# Patient Record
Sex: Male | Born: 2000 | Race: Black or African American | Hispanic: No | Marital: Single | Smoking: Never smoker
Health system: Southern US, Community
[De-identification: ages and names within clinical notes are randomized; demographics above are authoritative.]

## PROBLEM LIST (undated history)

## (undated) DIAGNOSIS — I429 Cardiomyopathy, unspecified: Secondary | ICD-10-CM

---

## 2019-04-30 ENCOUNTER — Emergency Department (HOSPITAL_COMMUNITY)
Admission: EM | Admit: 2019-04-30 | Discharge: 2019-04-30 | Disposition: A | Discharge status: Home / Self Care | Payer: BC Managed Care – PPO | Attending: Emergency Medicine | Admitting: Emergency Medicine

## 2019-04-30 ENCOUNTER — Encounter (HOSPITAL_COMMUNITY): Payer: Self-pay | Admitting: Emergency Medicine

## 2019-04-30 ENCOUNTER — Emergency Department (HOSPITAL_COMMUNITY): Payer: BC Managed Care – PPO

## 2019-04-30 DIAGNOSIS — R079 Chest pain, unspecified: Secondary | ICD-10-CM

## 2019-04-30 DIAGNOSIS — R0789 Other chest pain: Secondary | ICD-10-CM | POA: Insufficient documentation

## 2019-04-30 LAB — TROPONIN I (HIGH SENSITIVITY)
Troponin I (High Sensitivity): 13 ng/L (ref <18)
Troponin I (High Sensitivity): 12 ng/L (ref <18)

## 2019-04-30 LAB — BASIC METABOLIC PANEL
Anion gap: 9 (ref 5–15)
BUN: 11 mg/dL (ref 6–20)
CO2: 25 mmol/L (ref 22–32)
Calcium: 9.1 mg/dL (ref 8.9–10.3)
Chloride: 106 mmol/L (ref 98–111)
Creatinine, Ser: 1.15 mg/dL (ref 0.61–1.24)
GFR calc Af Amer: >60 mL/min (ref >60)
GFR calc non Af Amer: >60 mL/min (ref >60)
Glucose, Bld: 85 mg/dL (ref 70–99)
Potassium: 3.9 mmol/L (ref 3.5–5.1)
Sodium: 140 mmol/L (ref 135–145)

## 2019-04-30 LAB — CBC
HCT: 44.1 % (ref 39.0–52.0)
Hemoglobin: 13.9 g/dL (ref 13.0–17.0)
MCH: 28.8 pg (ref 26.0–34.0)
MCHC: 31.5 g/dL (ref 30.0–36.0)
MCV: 91.5 fL (ref 80.0–100.0)
Platelets: 320 10*3/uL (ref 150–400)
RBC: 4.82 MIL/uL (ref 4.22–5.81)
RDW: 12.5 % (ref 11.5–15.5)
WBC: 5.5 10*3/uL (ref 4.0–10.5)
nRBC: 0 % (ref 0.0–0.2)

## 2019-04-30 MED ORDER — SODIUM CHLORIDE 0.9% FLUSH: 3.0000 mL | Freq: Once | INTRAVENOUS | Status: DC

## 2019-04-30 NOTE — ED Triage Notes (Signed)
Pt. Stated, Ive had chest pain for over a week. No other symptoms.

## 2019-04-30 NOTE — ED Provider Notes (Signed)
[facility] EMERGENCY DEPARTMENT Provider Note   CSN: [id number] Arrival date & time: 04/30/19  1512     History   Chief Complaint Chief Complaint  Patient presents with   Chest Pain    HPI Nathan Clayton is a 18 y.o. male.  HPI: A 18 year old patient presents for evaluation of chest pain. Initial onset of pain was less than one hour ago. The patient's chest pain is described as heaviness/pressure/tightness and is worse with exertion. The patient's chest pain is middle- or left-sided, is not well-localized, is not sharp and does not radiate to the arms/jaw/neck. The patient does not complain of nausea and denies diaphoresis. The patient has no history of stroke, has no history of peripheral artery disease, has not smoked in the past 90 days, denies any history of treated diabetes, has no relevant family history of coronary artery disease (first degree relative at less than age 43), is not hypertensive, has no history of hypercholesterolemia and does not have an elevated BMI (>=30).    HPI   18 year old male presents today with complaints of chest pain.  Patient notes approximately 1.5 weeks ago he started developing tightness pressure and shortness of breath.  Patient notes this is worse with ambulation.  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  Social History Social History   Tobacco Use   Smoking status: Never Smoker   Smokeless tobacco: Never Used  Substance Use Topics   Alcohol use: Not Currently   Drug use: Not Currently     Allergies   Patient has no allergy information on record.   Review of Systems Review of Systems  All other systems reviewed and are negative.    Physical Exam Updated Vital Signs BP (!) 154/86    Pulse 62    Temp 98.5 F (36.9 C) (Oral)    Resp 12    SpO2 100%   Physical  Exam Vitals signs and nursing note reviewed.  Constitutional:      Appearance: He is well-developed.  HENT:     Head: Normocephalic and atraumatic.  Eyes:     General: No scleral icterus.       Right eye: No discharge.        Left eye: No discharge.     Conjunctiva/sclera: Conjunctivae normal.     Pupils: Pupils are equal, round, and reactive to light.  Neck:     Musculoskeletal: Normal range of motion.     Vascular: No JVD.     Trachea: No tracheal deviation.  Pulmonary:     Effort: Pulmonary effort is normal.     Breath sounds: No stridor.  Neurological:     Mental Status: He is alert and oriented to person, place, and time.     Coordination: Coordination normal.  Psychiatric:        Behavior: Behavior normal.        Thought Content: Thought content normal.        Judgment: Judgment normal.     ED Treatments / Results  Labs (all labs ordered are listed, but only abnormal results are displayed) Labs Reviewed  BASIC METABOLIC PANEL  CBC  TROPONIN I (HIGH SENSITIVITY)  TROPONIN I (HIGH SENSITIVITY)    EKG EKG Interpretation  Date/Time:  Monday April 30 2019 15:38:10 EDT Ventricular  Rate:  70 PR Interval:  158 QRS Duration: 94 QT Interval:  354 QTC Calculation: 382 R Axis:   102 Text Interpretation:  Normal sinus rhythm Biatrial enlargement Rightward axis Incomplete right bundle branch block Septal infarct , age undetermined T wave abnormality, consider inferolateral ischemia Abnormal ECG No old tracing to compare Confirmed by Linwood Dibbles [phone]) on 04/30/2019 5:53:23 PM   Radiology Dg Chest 2 View  Result Date: 04/30/2019 CLINICAL DATA:  Chest pain EXAM: CHEST - 2 VIEW COMPARISON:  None. FINDINGS: The heart size and mediastinal contours are within normal limits. Both lungs are clear. The visualized skeletal structures are unremarkable. IMPRESSION: No active cardiopulmonary disease. Electronically Signed   By: Gerome Sam III M.D   On: 04/30/2019 15:54     Procedures Procedures (including critical care time)  Medications Ordered in ED Medications  sodium chloride flush (NS) 0.9 % injection 3 mL (has no administration in time range)     Initial Impression / Assessment and Plan / ED Course  I have reviewed the triage vital signs and the nursing notes.  Pertinent labs & imaging results that were available during my care of the patient were reviewed by me and considered in my medical decision making (see chart for details).     HEAR Score: 2   Labs: Troponin x2, ED EKG, chest x-ray, CBC, BMP  Imaging:  Consults:  Therapeutics:  Discharge Meds:   Assessment/Plan: 18 year old male presents today with complaints of chest discomfort.  Patient has a history of hypertrophic cardiomyopathy, question similar presentation presently.  He is well-appearing in no acute distress.  His heart score is 2, case was discussed with attending physician who agrees to assessment and plan today.  Patient stable for outpatient cardiology follow-up.  I discussed plan with the patient, his mother, and also his aunt who was on the phone.  He will follow closely he will return immediately if develops any new or worsening signs or symptoms.  He will not participate in any strenuous physical activity until cardiology follow-up. Present verbalized understanding and agreement to today's plan had no further questions or concerns at time of discharge.   Final Clinical Impressions(s) / ED Diagnoses   Final diagnoses:  Chest pain, unspecified type    ED Discharge Orders    None       Rosalio Loud 04/30/19 Ocie Cornfield, MD 05/02/19 [phone]

## 2019-04-30 NOTE — Discharge Instructions (Addendum)
Please read attached information. If you experience any new or worsening signs or symptoms please return to the emergency room for evaluation. Please follow-up with your primary care provider or specialist as discussed.  °

## 2019-04-30 NOTE — ED Notes (Signed)
Patient verbalizes understanding of discharge instructions. Opportunity for questioning and answers were provided. Armband removed by staff, pt discharged from ED.  

## 2019-05-11 ENCOUNTER — Encounter: Payer: Self-pay | Admitting: Cardiovascular Disease

## 2019-05-11 ENCOUNTER — Ambulatory Visit (INDEPENDENT_AMBULATORY_CARE_PROVIDER_SITE_OTHER): Payer: Self-pay | Admitting: Cardiovascular Disease

## 2019-05-11 ENCOUNTER — Telehealth: Payer: Self-pay

## 2019-05-11 ENCOUNTER — Other Ambulatory Visit: Payer: Self-pay

## 2019-05-11 VITALS — BP 124/80 | HR 66 | Temp 97.9°F | Ht 74.0 in | Wt 187.0 lb

## 2019-05-11 DIAGNOSIS — I421 Obstructive hypertrophic cardiomyopathy: Secondary | ICD-10-CM | POA: Insufficient documentation

## 2019-05-11 DIAGNOSIS — R079 Chest pain, unspecified: Secondary | ICD-10-CM

## 2019-05-11 DIAGNOSIS — R0789 Other chest pain: Secondary | ICD-10-CM

## 2019-05-11 NOTE — Patient Instructions (Addendum)
Medication Instructions:  Your physician recommends that you continue on your current medications as directed. Please refer to the Current Medication list given to you today.  If you need a refill on your cardiac medications before your next appointment, please call your pharmacy.   Lab work: None. PLEASE HAVE YOUR PEDIATRIC CARDIOLOGIST IN [place], [place] FAX YOUR RECORDS TO OUR OFFICE AT [phone] If you have labs (blood work) drawn today and your tests are completely normal, you will receive your results only by: Marland Kitchen MyChart Message (if you have MyChart) OR . A paper copy in the mail If you have any lab test that is abnormal or we need to change your treatment, we will call you to review the results.  Testing/Procedures: Your physician has requested that you have an echocardiogram. Echocardiography is a painless test that uses sound waves to create images of your heart. It provides your doctor with information about the size and shape of your heart and how well your heart's chambers and valves are working. This procedure takes approximately one hour. There are no restrictions for this procedure. LOCATION: [facility]: [place], [place], [place] [address] TO BE SCHEDULED   Follow-Up: At [facility], you and your health needs are our priority.  As part of our continuing mission to provide you with exceptional heart care, we have created designated Provider Care Teams.  These Care Teams include your primary Cardiologist (physician) and Advanced Practice Providers (APPs -  Physician Assistants and Nurse Practitioners) who all work together to provide you with the care you need, when you need it. You will need a follow up appointment in 3 months with Dr. Quay Burow.  Please call our office 2 months in advance to schedule this appointment.

## 2019-05-11 NOTE — Telephone Encounter (Signed)
Dr. Gwenlyn Found requested that pt pediatric cardiologist, Dr. Marcellina Millin, be contacted so that he could speak with him.  Marcellina Millin, MD  [address]  [place], [place] [address]  [phone]   Spoke with Threasa Beards who stated that Dr. Linton Rump out of town and will not be back until Monday of next week. Informed her that the next time Dr. Gwenlyn Found is in the office is next week Tuesday. She states Dr. Linton Rump will be in office next week Tuesday as well. Requested that she send a message to Dr. Linton Rump requesting that he call Dr. Gwenlyn Found on this coming Tuesday. She verbalized understanding and will do so.

## 2019-05-11 NOTE — Progress Notes (Signed)
05/11/2019 Frederik Pear   10/03/2000  [id number]  Primary Physician Patient, No Pcp Per Primary Cardiologist: Runell Gess MD Nicholes Calamity, [place]  HPI:  NICKOLAOS BRALLIER is a 18 y.o. thin and fit appearing single African-American male accompanied by his mother Tamera Punt who was referred by the emergency room for evaluation of chest pain.  He has no cardiac risk factors.  Is on no medications.  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  He apparently saw a pediatric cardiologist in [place] and was diagnosed with hypertrophic cardiomyopathy.  This records were not available to me currently.  He was recently seen in the emergency room by Dr. Lynelle Doctor 04/22/2019.  Enzymes were negative.  EKG did show LVH with repolarization changes.  He gets chest pain principally with exertion.  There is no family history of hypertrophic cardiomyopathy or sudden cardiac death.   No outpatient medications have been marked as taking for the 05/11/19 encounter (Office Visit) with Runell Gess, MD.     No Known Allergies  Social History   Socioeconomic History  . Marital status: Single    Spouse name: Not on file  . Number of children: Not on file  . Years of education: Not on file  . Highest education level: Not on file  Occupational History  . Not on file  Social Needs  . Financial resource strain: Not on file  . Food insecurity    Worry: Not on file    Inability: Not on file  . Transportation needs    Medical: Not on file    Non-medical: Not on file  Tobacco Use  . Smoking status: Never Smoker  . Smokeless tobacco: Never Used  Substance and Sexual Activity  . Alcohol use: Not Currently  . Drug use: Not Currently  . Sexual activity: Not on file  Lifestyle  . Physical activity    Days per week: Not on file    Minutes per session: Not on file  . Stress: Not on file  Relationships  . Social Musician on phone: Not on file    Gets together: Not on file     Attends religious service: Not on file    Active member of club or organization: Not on file    Attends meetings of clubs or organizations: Not on file    Relationship status: Not on file  . Intimate partner violence    Fear of current or ex partner: Not on file    Emotionally abused: Not on file    Physically abused: Not on file    Forced sexual activity: Not on file  Other Topics Concern  . Not on file  Social History Narrative  . Not on file     Review of Systems: General: negative for chills, fever, night sweats or weight changes.  Cardiovascular: negative for chest pain, dyspnea on exertion, edema, orthopnea, palpitations, paroxysmal nocturnal dyspnea or shortness of breath Dermatological: negative for rash Respiratory: negative for cough or wheezing Urologic: negative for hematuria Abdominal: negative for nausea, vomiting, diarrhea, bright red blood per rectum, melena, or hematemesis Neurologic: negative for visual changes, syncope, or dizziness All other systems reviewed and are otherwise negative except as noted above.    Blood pressure 124/80, pulse 66, temperature 97.9 F (36.6 C), height 6\' 2"  (1.88 m), weight 187 lb (84.8 kg).  General appearance: alert and no distress Neck: no adenopathy, no carotid bruit, no JVD,  supple, symmetrical, trachea midline and thyroid not enlarged, symmetric, no tenderness/mass/nodules Lungs: clear to auscultation bilaterally Heart: regular rate and rhythm, S1, S2 normal, no murmur, click, rub or gallop Extremities: extremities normal, atraumatic, no cyanosis or edema Pulses: 2+ and symmetric Skin: Skin color, texture, turgor normal. No rashes or lesions Neurologic: Alert and oriented X 3, normal strength and tone. Normal symmetric reflexes. Normal coordination and gait  EKG not performed today  ASSESSMENT AND PLAN:   Atypical chest pain Mr. 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  He was recently seen in the ER 04/22/2019 with chest pain.  He has had chest pain for several years and was evaluated by a pediatric cardiologist in [place].  His work-up in the ER was unrevealing.  His enzymes were negative.  His EKG does show changes of LVH with repolarization changes although he is a young male with an EF less than 35 and he does not necessarily meet voltage criteria.  There is a question of hypertrophic cardiomyopathy being worked up by a cardiologist in [place].  I am going to obtain those records.  I do not think his symptoms are ischemically mediated.  If he is a hypertroph I will refer him to the appropriate center for further evaluation.      Lorretta Harp MD FACP,FACC,FAHA, [facility] 05/11/2019 10:48 AM

## 2019-05-11 NOTE — Assessment & Plan Note (Signed)
Mr. 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  He was recently seen in the ER 04/22/2019 with chest pain.  He has had chest pain for several years and was evaluated by a pediatric cardiologist in [place].  His work-up in the ER was unrevealing.  His enzymes were negative.  His EKG does show changes of LVH with repolarization changes although he is a young male with an EF less than 35 and he does not necessarily meet voltage criteria.  There is a question of hypertrophic cardiomyopathy being worked up by a cardiologist in [place].  I am going to obtain those records.  I do not think his symptoms are ischemically mediated.  If he is a hypertroph I will refer him to the appropriate center for further evaluation.

## 2019-05-15 ENCOUNTER — Telehealth: Payer: Self-pay

## 2019-05-15 NOTE — Telephone Encounter (Signed)
Dr. Gwenlyn Found requested that pt pediatric cardiologist, Dr. Marcellina Millin, be contacted so that he could speak with him.  Marcellina Millin, MD  [address]  [place], [place] [address]  [phone]   Spoke with Threasa Beards who stated that Dr. Linton Rump out of town and will not be back until Monday of next week. Informed her that the next time Dr. Gwenlyn Found is in the office is next week Tuesday. She states Dr. Linton Rump will be in office next week Tuesday as well. Requested that she send a message to Dr. Linton Rump requesting that he call Dr. Gwenlyn Found on this coming Tuesday. She verbalized understanding and will do so.   Contacted office again today. Dr. Linton Rump called Dr. Gwenlyn Found back

## 2019-05-15 NOTE — Telephone Encounter (Addendum)
Contacted pt. DPR ON FILE. Left detailed message stating that Dr. Gwenlyn Found spoke with his old pediatric cardiologist, Dr. Marcellina Millin, who reviewed his chart and felt that there was nothing cardiovascular that was causing the pt CP. Dr. Gwenlyn Found is okay with pt returning back to football practice with no restrictions after results of echo and pt may follow up PRN. Advised to call back with any questions or concerns

## 2019-05-22 ENCOUNTER — Other Ambulatory Visit: Payer: Self-pay

## 2019-05-22 ENCOUNTER — Ambulatory Visit (HOSPITAL_COMMUNITY): Payer: Self-pay | Attending: Cardiology

## 2019-05-22 DIAGNOSIS — R079 Chest pain, unspecified: Secondary | ICD-10-CM | POA: Insufficient documentation

## 2019-12-18 ENCOUNTER — Telehealth: Payer: Self-pay | Admitting: *Deleted

## 2019-12-18 NOTE — Telephone Encounter (Signed)
A message was left, re: his follow up visit. 

## 2020-01-04 ENCOUNTER — Encounter: Payer: Self-pay | Admitting: General Practice

## 2020-03-13 ENCOUNTER — Observation Stay (HOSPITAL_COMMUNITY)
Admission: EM | Admit: 2020-03-13 | Discharge: 2020-03-14 | Disposition: A | Discharge status: Home / Self Care | Payer: 59 | Attending: Internal Medicine | Admitting: Internal Medicine

## 2020-03-13 ENCOUNTER — Encounter (HOSPITAL_COMMUNITY): Payer: Self-pay | Admitting: Emergency Medicine

## 2020-03-13 ENCOUNTER — Other Ambulatory Visit: Payer: Self-pay

## 2020-03-13 ENCOUNTER — Observation Stay (HOSPITAL_BASED_OUTPATIENT_CLINIC_OR_DEPARTMENT_OTHER): Payer: 59

## 2020-03-13 DIAGNOSIS — R0789 Other chest pain: Secondary | ICD-10-CM

## 2020-03-13 DIAGNOSIS — R42 Dizziness and giddiness: Secondary | ICD-10-CM

## 2020-03-13 DIAGNOSIS — I421 Obstructive hypertrophic cardiomyopathy: Secondary | ICD-10-CM

## 2020-03-13 DIAGNOSIS — Z20822 Contact with and (suspected) exposure to covid-19: Secondary | ICD-10-CM | POA: Insufficient documentation

## 2020-03-13 DIAGNOSIS — I422 Other hypertrophic cardiomyopathy: Secondary | ICD-10-CM | POA: Insufficient documentation

## 2020-03-13 DIAGNOSIS — R7401 Elevation of levels of liver transaminase levels: Secondary | ICD-10-CM | POA: Diagnosis not present

## 2020-03-13 DIAGNOSIS — R079 Chest pain, unspecified: Secondary | ICD-10-CM

## 2020-03-13 DIAGNOSIS — R55 Syncope and collapse: Secondary | ICD-10-CM | POA: Diagnosis not present

## 2020-03-13 DIAGNOSIS — N179 Acute kidney failure, unspecified: Secondary | ICD-10-CM | POA: Diagnosis not present

## 2020-03-13 HISTORY — DX: Cardiomyopathy, unspecified: I42.9

## 2020-03-13 LAB — COMPREHENSIVE METABOLIC PANEL
ALT: 23 U/L (ref 0–44)
AST: 49 U/L — ABNORMAL HIGH (ref 15–41)
Albumin: 4.2 g/dL (ref 3.5–5.0)
Alkaline Phosphatase: 47 U/L (ref 38–126)
Anion gap: 16 — ABNORMAL HIGH (ref 5–15)
BUN: 12 mg/dL (ref 6–20)
CO2: 20 mmol/L — ABNORMAL LOW (ref 22–32)
Calcium: 9.6 mg/dL (ref 8.9–10.3)
Chloride: 103 mmol/L (ref 98–111)
Creatinine, Ser: 1.52 mg/dL — ABNORMAL HIGH (ref 0.61–1.24)
GFR calc Af Amer: >60 mL/min (ref >60)
GFR calc non Af Amer: >60 mL/min (ref >60)
Glucose, Bld: 87 mg/dL (ref 70–99)
Potassium: 3.8 mmol/L (ref 3.5–5.1)
Sodium: 139 mmol/L (ref 135–145)
Total Bilirubin: 0.8 mg/dL (ref 0.3–1.2)
Total Protein: 7.8 g/dL (ref 6.5–8.1)

## 2020-03-13 LAB — TROPONIN I (HIGH SENSITIVITY)
Troponin I (High Sensitivity): 6 ng/L (ref <18)
Troponin I (High Sensitivity): 10 ng/L (ref <18)

## 2020-03-13 LAB — CBC
HCT: 48.5 % (ref 39.0–52.0)
Hemoglobin: 14.8 g/dL (ref 13.0–17.0)
MCH: 27.8 pg (ref 26.0–34.0)
MCHC: 30.5 g/dL (ref 30.0–36.0)
MCV: 91 fL (ref 80.0–100.0)
Platelets: 309 10*3/uL (ref 150–400)
RBC: 5.33 MIL/uL (ref 4.22–5.81)
RDW: 12.1 % (ref 11.5–15.5)
WBC: 6.4 10*3/uL (ref 4.0–10.5)
nRBC: 0 % (ref 0.0–0.2)

## 2020-03-13 LAB — ECHOCARDIOGRAM COMPLETE
Area-P 1/2: 3.08 cm2
S' Lateral: 2.7 cm

## 2020-03-13 LAB — SARS CORONAVIRUS 2 BY RT PCR (HOSPITAL ORDER, PERFORMED IN ~~LOC~~ HOSPITAL LAB): SARS Coronavirus 2: NEGATIVE

## 2020-03-13 MED ORDER — ACETAMINOPHEN 325 MG PO TABS: 650.0000 mg | ORAL_TABLET | ORAL | Status: DC | PRN

## 2020-03-13 MED ORDER — SODIUM CHLORIDE 0.9 % IV BOLUS
1000.0000 mL | Freq: Once | INTRAVENOUS | Status: AC
  Administered 2020-03-13: 1000 mL via INTRAVENOUS

## 2020-03-13 MED ORDER — ONDANSETRON HCL 4 MG/2ML IJ SOLN: 4.0000 mg | Freq: Four times a day (QID) | INTRAMUSCULAR | Status: DC | PRN

## 2020-03-13 MED ORDER — NITROGLYCERIN 0.4 MG SL SUBL: 0.4000 mg | SUBLINGUAL_TABLET | SUBLINGUAL | Status: DC | PRN

## 2020-03-13 NOTE — H&P (Addendum)
Cardiology Admission History and Physical:   Patient ID: Nathan Clayton MRN: [id number]; DOB: [birth year]   Admission date: 03/13/2020  Primary Care Provider: Patient, No Pcp Per [facility] Cardiologist: Quay Burow, MD  Elias-F[facility] Solis Electrophysiologist:  None   Chief Complaint:  Chest Pain  Patient Profile:   Nathan Clayton is a 19 y.o. male with a history of chest pain and possible hypertrophic cardiomyopathy who presents to the ED today with chest pain and near syncope.  History of Present Illness:   Nathan Clayton is a 19 year old male with history of chest pain and possible hypertrophic cardiomyopathy who has been seen by Dr. Gwenlyn Found in the past. 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 He saw a pediatric cardiologist in [place], [place], and was diagnosed with hypertrophic cardiomyopathy. Per review in Care Everywhere, he underwent and Cardiopulmonary Exercise Test in 12/2017 which was abnormal and showed mildly reduced exercise capacity consistent with ventilatory limitations. There was no evidence of exercise induced cardiac dysrhythmias, ischemia, or bronchospasm.He was seen in the ED in 04/2019 for chest pain that was worse with exertion. EKG showed LVH with repolarization changes. Enzymes were negative. He was felt to be stable for discharge with outpatient Cardiology follow-up. He was seen by Dr. Gwenlyn Found in 05/2019 and Echo was ordered for further evaluation. Echo showed LVEF of 60-65% with moderate LVH and asymmetric hypertrophy in the posterior wall. Average LV global longitudinal strain is -18.3. This pattern felt to be atypical for hypertrophic cardiomyopathy.  Patient presented to the [facility] ED today for further evaluation of chest pain and near syncope while at football practice at [employer]. Patient reports he had been practicing/running for 30 minutes when he developed left sided  non-radiating chest pain. Describes this pain as a tightness/sharpness. Similar to pain that he usual gets just slightly worse. He states he typically has chest pain at least 2 times per week, usually with exertion but sometimes at rest. He notes shortness of breath, palpitations, and sweating with the chest pain but he was running in 90 degree heat at the time. No nausea or vomiting. After onset of chest pain, he developed lightheadedness and dizziness and felt like he may pass out. No orthopnea, PND, or edema. No recent fevers or illnesses. No URI symptoms or GI symptoms. Patient told his coach about his symptoms and coach placed him in an ice bath. Chest pain persisted so he came to the ED for further evaluation. Patient received 324 mg of Aspirin prior to arrival.  In the ED, vitals stable. EKG showed normal sinus rhythm, rate 97 bpm, with right axis deviation, repolarization pattern in anterior leads, and mild T wave inversions in inferior leads (may be repolarization changes from LVH). Initial high-sensitivity troponin negative. WBC 6.4, Hgb 14.8, Plts 309. Na 139, K 3.8, Glucose 87, BUN 12, Cr 1.52. Albumin 4.2, AST 49, ALT 23, Alk Phos 47, Total Bili 0.8. COVID-19 pending.   At the time of this evaluation, patient still having 5/6 out of 10 chest pain but resting comfortably.   He denies any tobacco, alcohol, or recreational drug use. No known family history of HOCM. However, he does have a family history of a heart disease with paternal grandfather having a MI in his mid 15's to 27's. Patient's father also reports that his grandparents (patient's great grandparents) have history of MI. Father states several of his relatives  died suddenly from a MI.   Past Medical History:  Diagnosis Date  . Cardiomyopathy [facility])     History reviewed. No pertinent surgical history.   Medications Prior to Admission: Prior to Admission medications   Not on File     Allergies:   No Known Allergies  Social  History:   Social History   Socioeconomic History  . Marital status: Single    Spouse name: Not on file  . Number of children: Not on file  . Years of education: Not on file  . Highest education level: Not on file  Occupational History  . Not on file  Tobacco Use  . Smoking status: Never Smoker  . Smokeless tobacco: Never Used  Substance and Sexual Activity  . Alcohol use: Not Currently  . Drug use: Not Currently  . Sexual activity: Not on file  Other Topics Concern  . Not on file  Social History Narrative  . Not on file   Social Determinants of Health   Financial Resource Strain:   . Difficulty of Paying Living Expenses:   Food Insecurity:   . Worried About Charity fundraiser in the Last Year:   . Arboriculturist in the Last Year:   Transportation Needs:   . Film/video editor (Medical):   Marland Kitchen Lack of Transportation (Non-Medical):   Physical Activity:   . Days of Exercise per Week:   . Minutes of Exercise per Session:   Stress:   . Feeling of Stress :   Social Connections:   . Frequency of Communication with Friends and Family:   . Frequency of Social Gatherings with Friends and Family:   . Attends Religious Services:   . Active Member of Clubs or Organizations:   . Attends Archivist Meetings:   Marland Kitchen Marital Status:   Intimate Partner Violence:   . Fear of Current or Ex-Partner:   . Emotionally Abused:   Marland Kitchen Physically Abused:   . Sexually Abused:     Family History:   The patient's family history includes Kidney failure in his paternal grandmother; Lupus in his paternal grandmother; Stroke in his paternal grandfather.    ROS:  Please see the history of present illness.  All other ROS reviewed and negative.     Physical Exam/Data:   Vitals:   03/13/20 1022  BP: 135/83  Pulse: 95  Resp: 16  Temp: 97.8 F (36.6 C)  TempSrc: Oral  SpO2: 100%   No intake or output data in the 24 hours ending 03/13/20 1243 Last 3 Weights 05/11/2019  Weight  (lbs) 187 lb  Weight (kg) 84.823 kg     There is no height or weight on file to calculate BMI.  General: 19 y.o. thin African-American male resting comfortably in no acute distress.  HEENT: Normocephalic and atraumatic. Sclera clear.  Neck: Supple. No carotid bruits. No JVD. Heart: Tachycardic with regular rhythm. II-III/VI systolic murmur heard best at left upper sternal border. Distinct S1 and S2. No gallops or rubs. Radial and distal pedal pulses 2+ and equal bilaterally. Lungs: No increased work of breathing. Clear to ausculation bilaterally. No wheezes, rhonchi, or rales.  Abdomen: Soft, non-distended, and non-tender to palpation. Bowel sounds present. MSK: Normal strength and tone for age. Extremities: No lower extremity edema.    Skin: Warm and dry. Neuro: Alert and oriented x3. No focal deficits. Psych: Normal affect. Responds appropriately.  EKG: The ECG that was done was personally reviewed and demonstrates normal sinus  rhythm, rate 97 bpm, with right axis deviation, repolarization pattern in anterior leads, and mild T wave inversions in inferior leads (may be repolarization changes from LVH)  Telemetry: Telemetry personally reviewed and demonstrates Sinus rhythm with rates ranging from the 90's to 120's.  Relevant CV Studies:  Echocardiogram 05/22/2019: Impressions: 1. Left ventricular ejection fraction, by visual estimation, is 60 to  65%. The left ventricle has normal function. There is moderately increased  left ventricular hypertrophy.  2. Asymmetric hypertrophy measuring 36m in posterior wall (157min basal  septum). Could represent hypertrophic cardiomyopathy, but would be an  unusual variant with posterior wall hypertrophy more pronounced than  septum. Consider cardiac MRI for further  evaluation  3. The average left ventricular global longitudinal strain is -18.3 %.  4. Global right ventricle has normal systolic function.The right  ventricular size is normal.  No increase in right ventricular wall  thickness.  5. Left atrial size was normal.  6. Right atrial size was normal.  7. Small pericardial effusion.  8. The mitral valve is normal in structure. Trace mitral valve  regurgitation.  9. The tricuspid valve is normal in structure. Tricuspid valve  regurgitation is trivial.  10. The aortic valve is tricuspid Aortic valve regurgitation was not  visualized by color flow Doppler.  11. The pulmonic valve was grossly normal. Pulmonic valve regurgitation is  not visualized by color flow Doppler.  12. The tricuspid regurgitant velocity is 2.08 m/s, and with an assumed  right atrial pressure of 3 mmHg, the estimated right ventricular systolic  pressure is normal at 20.4 mmHg.  13. The inferior vena cava is normal in size with greater than 50%  respiratory variability, suggesting right atrial pressure of 3 mmHg.   Laboratory Data:  High Sensitivity Troponin:   Recent Labs  Lab 03/13/20 1047  TROPONINIHS 6      Chemistry Recent Labs  Lab 03/13/20 1047  NA 139  K 3.8  CL 103  CO2 20*  GLUCOSE 87  BUN 12  CREATININE 1.52*  CALCIUM 9.6  GFRNONAA >60  GFRAA >60  ANIONGAP 16*    Recent Labs  Lab 03/13/20 1047  PROT 7.8  ALBUMIN 4.2  AST 49*  ALT 23  ALKPHOS 47  BILITOT 0.8   Hematology Recent Labs  Lab 03/13/20 1047  WBC 6.4  RBC 5.33  HGB 14.8  HCT 48.5  MCV 91.0  MCH 27.8  MCHC 30.5  RDW 12.1  PLT 309   BNPNo results for input(s): BNP, PROBNP in the last 168 hours.  DDimer No results for input(s): DDIMER in the last 168 hours.   Radiology/Studies:  No results found.  TIMI Risk Score for Unstable Angina or Non-ST Elevation MI:   The patient's TIMI risk score is 0-1, which indicates a 5% risk of all cause mortality, new or recurrent myocardial infarction or need for urgent revascularization in the next 14 days.     Assessment and Plan:   Chest Pain - Patient presents with chest pain and near syncope  while at football practice. - EKG shows no acute ischemic changes compared to prior tracings. - Initial high-sensitivity troponin negative. Repeat pending. - Patient still having mild 5/10 chest tightness. - Do not suspect this is due to CAD/ACS. - Discussed with MD - will admit for observation and plan for cardiac MRI tomorrow given history of hypertrophic cardiomyoapthy.  Near Syncope - Suspect this was do to exercising in extreme heat/dehydration given mild AKI. - Receiving 1L fluid  bolus in the ED. - Will check orthostatic vital signs. - Continue telemetry.  Hypertrophic Cardiomyopathy - Diagnosed with hypertrophic cardiomyopathy by pediatric Cardiologist in [place], [place], and felt to be due to athlete's heart vs hypertensive changes. Noted to have elevated BP at multiple visit raising concern for hypertension but patient denies formal diagnosis of this. Cardiopulmonary Exercise Test in 12/2017 which was abnormal and showed mildly reduced exercise capacity consistent with ventilatory limitations. There was no evidence of exercise induced cardiac dysrhythmias, ischemia, or bronchospasm. - Last Echo in 05/2019 showed LVEF of 60-65% with moderate LVH and asymmetric hypertrophy measuring 41m in posterior wall (felt to be atypical for hypertrophic cardiomyopathy). Average LV global longitudinal strain -18.3. - Prominent systolic murmur noted on exam. - Will order cardiac MRI for further evaluation.  AKI - Creatinine 1.51 on presentation. Baseline 1.1.  - Suspect this is secondary to dehydration.  - Will give another 1L of normal saline. - Repeat CMET tomorrow.  Elevated AST - AST minimally elevated at 49.  - Will repeat CMET tomorrow.  Mild Anion Gap - Anion gap of 16. - Wonder if this could be due to dehydration. - Will repeat CMET tomorrow.  Severity of Illness: The appropriate patient status for this patient is OBSERVATION. Observation status is judged to be reasonable and  necessary in order to provide the required intensity of service to ensure the patient's safety. The patient's presenting symptoms, physical exam findings, and initial radiographic and laboratory data in the context of their medical condition is felt to place them at decreased risk for further clinical deterioration. Furthermore, it is anticipated that the patient will be medically stable for discharge from the hospital within 2 midnights of admission. The following factors support the patient status of observation.   " The patient's presenting symptoms include chest pain and near syncope. " The physical exam findings as above. " The initial radiographic and laboratory data as above.     For questions or updates, please contact CRocky Boy WestPlease consult [email or web address] for contact info under     Signed, CDarreld Mclean PA-C  03/13/2020 12:43 PM

## 2020-03-13 NOTE — ED Provider Notes (Signed)
[facility] EMERGENCY DEPARTMENT Provider Note   CSN: [id number] Arrival date & time: 03/13/20  1019     History Chief Complaint  Patient presents with  . Near Syncope    DANTHONY KENDRIX is a 19 y.o. male.  HPI    19 yo male history of possible hypertrophic cardiomyopathy presents today after football practice.  He was out practicing in the 9 degree heat for approximately 30 minutes when he began to have some chest pain and feel generally weak.  He stopped playing.  The coach initiated an ice bath.  He is currently shivering and cold but not having any ongoing chest discomfort.  I have reviewed his records.  He was seen with a similar episode a year ago.  At that time, he was referred to cardiology.  They noted that he had been seen previously by a pediatric cardiologist and was being worked up for hypertrophic cardiomyopathy.  He had LVH noted on his EKG.  Is unable to elicit any definitive diagnosis from the notes.   Past Medical History:  Diagnosis Date  . Cardiomyopathy [facility])     Patient Active Problem List   Diagnosis Date Noted  . Atypical chest pain 05/11/2019  . Obstructive hypertrophic cardiomyopathy (HCC) 05/11/2019    History reviewed. No pertinent surgical history.     Family History  Problem Relation Age of Onset  . Lupus Paternal Grandmother   . Kidney failure Paternal Grandmother   . Stroke Paternal Grandfather     Social History   Tobacco Use  . Smoking status: Never Smoker  . Smokeless tobacco: Never Used  Substance Use Topics  . Alcohol use: Not Currently  . Drug use: Not Currently    Home Medications Prior to Admission medications   Not on File    Allergies    Patient has no known allergies.  Review of Systems   Review of Systems  All other systems reviewed and are negative.   Physical Exam Updated Vital Signs BP 135/83 (BP Location: Right Arm)   Pulse 95   Temp 97.8 F (36.6 C) (Oral)   Resp 16   SpO2 100%    Physical Exam Vitals reviewed.  Constitutional:      Appearance: Normal appearance.  HENT:     Head: Normocephalic.     Right Ear: External ear normal.     Left Ear: External ear normal.     Nose: Nose normal.     Mouth/Throat:     Mouth: Mucous membranes are moist.     Pharynx: Oropharynx is clear.  Cardiovascular:     Rate and Rhythm: Normal rate and regular rhythm.     Pulses: Normal pulses.  Pulmonary:     Effort: Pulmonary effort is normal.     Breath sounds: Normal breath sounds.  Abdominal:     General: Abdomen is flat.     Palpations: Abdomen is soft.  Musculoskeletal:        General: Normal range of motion.     Cervical back: Normal range of motion.  Skin:    Capillary Refill: Capillary refill takes less than 2 seconds.     Comments: Cool and damp  Neurological:     General: No focal deficit present.     Mental Status: He is alert and oriented to person, place, and time.     Cranial Nerves: No cranial nerve deficit.     Sensory: No sensory deficit.     Motor: No weakness.  Coordination: Coordination normal.  Psychiatric:        Behavior: Behavior normal.     ED Results / Procedures / Treatments   Labs (all labs ordered are listed, but only abnormal results are displayed) Labs Reviewed  CBC  COMPREHENSIVE METABOLIC PANEL  TROPONIN I (HIGH SENSITIVITY)    EKG EKG Interpretation  Date/Time:  Thursday March 13 2020 10:25:07 EDT Ventricular Rate:  97 PR Interval:    QRS Duration: 95 QT Interval:  322 QTC Calculation: 409 R Axis:   95 Text Interpretation: Sinus rhythm Biatrial enlargement Borderline right axis deviation Nonspecific T abnormalities, inferior leads ST elev, probable normal early repol pattern since last ekg t waves in v5 and v6 have resolved otherwise no significant change Confirmed by Margarita Grizzle [phone]) on 03/13/2020 10:35:12 AM   Radiology No results found.  Procedures Procedures (including critical care  time)  Medications Ordered in ED Medications  sodium chloride 0.9 % bolus 1,000 mL (has no administration in time range)    ED Course  I have reviewed the triage vital signs and the nursing notes.  Pertinent labs & imaging results that were available during my care of the patient were reviewed by me and considered in my medical decision making (see chart for details).    MDM Rules/Calculators/A&P                         19 yo male ho ?hypetrophic cardiomyopathy presents today with chest pain during work out.  Patient hemodynamically stable here in ED.  EKG with lvh . Cardiology consulted and seeing patient  Final Clinical Impression(s) / ED Diagnoses Final diagnoses:  Chest pain, unspecified type    Rx / DC Orders ED Discharge Orders    None       Margarita Grizzle, MD 03/13/20 1432

## 2020-03-13 NOTE — Progress Notes (Signed)
  Echocardiogram 2D Echocardiogram has been performed.  Dorena Dew Micahel Omlor 03/13/2020, 4:03 PM

## 2020-03-13 NOTE — ED Triage Notes (Signed)
Patient from [facility] for near syncope. Patient had been at football conditioning for 30 minutes when he started to feel like he was going to pass out. Patient was wearing short sleeve t-shirt and shorts. After telling his coach he was feeling like he would pass out, coach placed patient in a bath of ice water. Patient denies LOC, reports 7/10 chest tightness. Received 324 mg aspirin prior to arrival.

## 2020-03-14 ENCOUNTER — Observation Stay (HOSPITAL_COMMUNITY): Payer: 59

## 2020-03-14 DIAGNOSIS — I421 Obstructive hypertrophic cardiomyopathy: Secondary | ICD-10-CM | POA: Diagnosis not present

## 2020-03-14 DIAGNOSIS — R079 Chest pain, unspecified: Secondary | ICD-10-CM | POA: Diagnosis not present

## 2020-03-14 DIAGNOSIS — N179 Acute kidney failure, unspecified: Secondary | ICD-10-CM | POA: Diagnosis not present

## 2020-03-14 DIAGNOSIS — R42 Dizziness and giddiness: Secondary | ICD-10-CM | POA: Diagnosis not present

## 2020-03-14 LAB — COMPREHENSIVE METABOLIC PANEL
ALT: 22 U/L (ref 0–44)
AST: 51 U/L — ABNORMAL HIGH (ref 15–41)
Albumin: 3.6 g/dL (ref 3.5–5.0)
Alkaline Phosphatase: 43 U/L (ref 38–126)
Anion gap: 11 (ref 5–15)
BUN: 14 mg/dL (ref 6–20)
CO2: 25 mmol/L (ref 22–32)
Calcium: 8.9 mg/dL (ref 8.9–10.3)
Chloride: 105 mmol/L (ref 98–111)
Creatinine, Ser: 1.33 mg/dL — ABNORMAL HIGH (ref 0.61–1.24)
GFR calc Af Amer: >60 mL/min (ref >60)
GFR calc non Af Amer: >60 mL/min (ref >60)
Glucose, Bld: 83 mg/dL (ref 70–99)
Potassium: 3.9 mmol/L (ref 3.5–5.1)
Sodium: 141 mmol/L (ref 135–145)
Total Bilirubin: 1 mg/dL (ref 0.3–1.2)
Total Protein: 6.6 g/dL (ref 6.5–8.1)

## 2020-03-14 MED ORDER — GADOBUTROL 1 MMOL/ML IV SOLN
10.0000 mL | Freq: Once | INTRAVENOUS | Status: AC | PRN
  Administered 2020-03-14: 10 mL via INTRAVENOUS

## 2020-03-14 NOTE — Progress Notes (Signed)
Progress Note  Patient Name: Nathan PearDaniel K Clayton Date of Encounter: 03/14/2020  [facility] Cardiologist: Nanetta BattyJonathan Berry, MD   Subjective   No acute overnight events. Chest pain resolved. No complaints at this time.  Patient had cardiac MRI this morning. Results pending.  Inpatient Medications    Scheduled Meds:  Continuous Infusions:  PRN Meds: acetaminophen, nitroGLYCERIN, ondansetron (ZOFRAN) IV   Vital Signs    Vitals:   03/13/20 1022 03/13/20 1636 03/13/20 1719 03/14/20 0623  BP: 135/83  (!) 148/80 137/77  Pulse: 95 (!) 102 100 92  Resp: 16  18 17   Temp: 97.8 F (36.6 C)  98.7 F (37.1 C) 98.4 F (36.9 C)  TempSrc: Oral  Oral Oral  SpO2: 100% 99% 97% 100%    Intake/Output Summary (Last 24 hours) at 03/14/2020 0931 Last data filed at 03/13/2020 1714 Gross per 24 hour  Intake 1000 ml  Output --  Net 1000 ml   Last 3 Weights 05/11/2019  Weight (lbs) 187 lb  Weight (kg) 84.823 kg      Telemetry    Sinus rhythm with rates in the 60's at rest with brief spikes to the 80's to low 100's (suspect these are with anxiety). - Personally Reviewed  ECG    No new ECG tracing today. - Personally Reviewed  Physical Exam   GEN: No acute distress.   Neck: Supple. No JVD Cardiac: RRR. Systolic murmur less prominent today. No rubs or gallops. Radial and distal pedal pulses 2+ and equal bilaterally. Respiratory: Clear to auscultation bilaterally. No wheezes, rhonchi, or rales. GI: Soft, non-tender, non-distended  MS: No lower extremity edema. No deformity. Skin: Warm and dry. Neuro:  No focal deficits. Psych: Normal affect. Responds appropriately.  Labs    High Sensitivity Troponin:   Recent Labs  Lab 03/13/20 1047 03/13/20 1234  TROPONINIHS 6 10      Chemistry Recent Labs  Lab 03/13/20 1047 03/14/20 0621  NA 139 141  K 3.8 3.9  CL 103 105  CO2 20* 25  GLUCOSE 87 83  BUN 12 14  CREATININE 1.52* 1.33*  CALCIUM 9.6 8.9  PROT 7.8 6.6  ALBUMIN  4.2 3.6  AST 49* 51*  ALT 23 22  ALKPHOS 47 43  BILITOT 0.8 1.0  GFRNONAA >60 >60  GFRAA >60 >60  ANIONGAP 16* 11     Hematology Recent Labs  Lab 03/13/20 1047  WBC 6.4  RBC 5.33  HGB 14.8  HCT 48.5  MCV 91.0  MCH 27.8  MCHC 30.5  RDW 12.1  PLT 309    BNPNo results for input(s): BNP, PROBNP in the last 168 hours.   DDimer No results for input(s): DDIMER in the last 168 hours.   Radiology    ECHOCARDIOGRAM COMPLETE  Result Date: 03/13/2020    ECHOCARDIOGRAM REPORT   Patient Name:   Nathan PearDANIEL K Bielefeld Date of Exam: 03/13/2020 Medical Rec #:  [id number]       Height:       74.0 in Accession #:    [id number]      Weight:       187.0 lb Date of Birth:  18-Feb-2001      BSA:          2.112 m Patient Age:    18 years        BP:           143/75 mmHg Patient Gender: M  HR:           75 bpm. Exam Location:  Inpatient Procedure: 2D Echo, Cardiac Doppler and Color Doppler Indications:    R07.9* Chest pain, unspecified; R01.1 Murmur; I42.2 HOCM  History:        Patient has prior history of Echocardiogram examinations, most                 recent 05/22/2019. Cardiomyopathy. Pericaridal Effusion.  Sonographer:    Elmarie Shiley Dance Referring Phys: [id number] KENNETH C HILTY IMPRESSIONS  1. Left ventricular ejection fraction, by estimation, is 65 to 70%. The left ventricle has normal function. The left ventricle has no regional wall motion abnormalities. Left ventricular diastolic parameters were normal.  2. Right ventricular systolic function is normal. The right ventricular size is normal.  3. The mitral valve is normal in structure. Trivial mitral valve regurgitation. No evidence of mitral stenosis.  4. The aortic valve is normal in structure. Aortic valve regurgitation is not visualized. No aortic stenosis is present. FINDINGS  Left Ventricle: Left ventricular ejection fraction, by estimation, is 65 to 70%. The left ventricle has normal function. The left ventricle has no regional wall motion  abnormalities. The left ventricular internal cavity size was small. There is no left ventricular hypertrophy. Left ventricular diastolic parameters were normal. Right Ventricle: The right ventricular size is normal. No increase in right ventricular wall thickness. Right ventricular systolic function is normal. Left Atrium: Left atrial size was normal in size. Right Atrium: Right atrial size was normal in size. Pericardium: There is no evidence of pericardial effusion. Mitral Valve: The mitral valve is normal in structure. Trivial mitral valve regurgitation. No evidence of mitral valve stenosis. Tricuspid Valve: The tricuspid valve is normal in structure. Tricuspid valve regurgitation is trivial. Aortic Valve: The aortic valve is normal in structure. Aortic valve regurgitation is not visualized. No aortic stenosis is present. Pulmonic Valve: The pulmonic valve was normal in structure. Pulmonic valve regurgitation is not visualized. No evidence of pulmonic stenosis. Aorta: The aortic root and ascending aorta are structurally normal, with no evidence of dilitation. IAS/Shunts: The atrial septum is grossly normal.  LEFT VENTRICLE PLAX 2D LVIDd:         5.00 cm  Diastology LVIDs:         2.70 cm  LV e' lateral:   20.10 cm/s LV PW:         1.30 cm  LV E/e' lateral: 5.6 LV IVS:        0.90 cm  LV e' medial:    12.70 cm/s LVOT diam:     2.10 cm  LV E/e' medial:  8.9 LV SV:         70 LV SV Index:   33 LVOT Area:     3.46 cm  RIGHT VENTRICLE             IVC RV Basal diam:  2.90 cm     IVC diam: 1.00 cm RV S prime:     11.00 cm/s TAPSE (M-mode): 2.0 cm LEFT ATRIUM             Index       RIGHT ATRIUM           Index LA diam:        2.90 cm 1.37 cm/m  RA Area:     11.70 cm LA Vol (A2C):   36.4 ml 17.23 ml/m RA Volume:   26.30 ml  12.45 ml/m LA Vol (A4C):  42.2 ml 19.98 ml/m LA Biplane Vol: 41.1 ml 19.46 ml/m  AORTIC VALVE LVOT Vmax:   97.40 cm/s LVOT Vmean:  66.000 cm/s LVOT VTI:    0.201 m  AORTA Ao Root diam: 3.20 cm  Ao Asc diam:  2.60 cm MITRAL VALVE MV Area (PHT): 3.08 cm     SHUNTS MV Decel Time: 246 msec     Systemic VTI:  0.20 m MV E velocity: 113.00 cm/s  Systemic Diam: 2.10 cm MV A velocity: 53.50 cm/s MV E/A ratio:  2.11 Kristeen Miss MD Electronically signed by Kristeen Miss MD Signature Date/Time: 03/13/2020/4:36:07 PM    Final     Cardiac Studies   Echocardiogram 03/14/2020: Impressions: 1. Left ventricular ejection fraction, by estimation, is 65 to 70%. The  left ventricle has normal function. The left ventricle has no regional  wall motion abnormalities. Left ventricular diastolic parameters were  normal.  2. Right ventricular systolic function is normal. The right ventricular  size is normal.  3. The mitral valve is normal in structure. Trivial mitral valve  regurgitation. No evidence of mitral stenosis.  4. The aortic valve is normal in structure. Aortic valve regurgitation is  not visualized. No aortic stenosis is present.   Patient Profile     DATRON BRAKEBILL is a 19 y.o. male with a history of chest pain and possible hypertrophic cardiomyopathy who was admitted on 03/13/2020 with chest pain and near syncope.  Assessment & Plan    Chest Pain - Patient presents with chest pain and near syncope while at football practice. - EKG shows no acute ischemic changes compared to prior tracings. - Initial high-sensitivity troponin negative x2. - Echo showed LVEF 65-70% with normal wall motion and diastolic function. No LVH reported. No significant valvular disease reported. - Chest pain resolved. - Do not suspect this is due to CAD/ACS. - Cardiac MRI performed this morning. Results pending.  Near Syncope - Suspect this was do to exercising in extreme heat/dehydration given mild AKI. - Echo as above. - No concerning arrhythmias noted on telemetry. - Received 2L of fluid yesterday. - Will check orthostatic vitals prior to discharge.  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 Noted to have elevated BP at multiple visit raising concern for hypertension but patient denies formal diagnosis of this. Cardiopulmonary Exercise Test in 12/2017 which was abnormal and showed mildly reduced exercise capacity consistent with ventilatory limitations. There was no evidence of exercise induced cardiac dysrhythmias, ischemia, or bronchospasm. - Echo in 05/2019 showed LVEF of 60-65% with moderate LVH and asymmetric hypertrophy measuring 20mm in posterior wall (felt to be atypical for hypertrophic cardiomyopathy). Average LV global longitudinal strain -18.3.  - Echo this admission showed LVEF 65-70% with normal wall motion and diastolic function. No LVH reported. No significant valvular disease reported. - Murmur has improved with fluids. - Cardiac MRI performed this morning. Results pending.  AKI - Creatinine 1.51 on presentation and improved to 1.33. Baseline 1.1.  - Suspect this is secondary to dehydration.  - Received 2L of fluid yesterday. - Encouraged patient to drink plenty of fluids. - Recommend repeating labs in about 1 week.  Elevated AST - AST minimally elevated at 49 on presentation and 51 today. Otherwise, LFTs normal. - Recommend follow-up with PCP.  Mild Anion Gap - Resolved - Anion gap of 16 on admission but resolved on repeat labs today. - Wonder if  this could have been due to dehydration.  For questions or updates, please contact [facility] Please consult [email or web address] for contact info under        Signed, Corrin Parker, PA-C  03/14/2020, 9:31 AM

## 2020-03-14 NOTE — Discharge Instructions (Signed)
OK to return to football.  Please make sure you stay well hydrated and drink plenty of fluids. Follow-up with primary care provider within the next few weeks. Follow-up with Dr. Allyson Sabal scheduled for 04/16/2020 at 9:00am. Please call us to reschedule if this date/time does not work for you.

## 2020-03-14 NOTE — Discharge Summary (Signed)
Discharge Summary    Patient ID: Nathan Clayton MRN: [id number]; DOB: 11/13/2000  Admit date: 03/13/2020 Discharge date: 03/14/2020  Primary Care Provider: Patient, No Pcp Per  Primary Cardiologist: Quay Burow, MD  Primary Electrophysiologist:  None   Discharge Diagnoses    Principal Problem:   Chest pain Active Problems:   HOCM (hypertrophic obstructive cardiomyopathy) ([place])   AKI (acute kidney injury) ([place])   Postural dizziness with presyncope    Diagnostic Studies/Procedures    Echocardiogram 03/13/2020: Impressions: 1. Left ventricular ejection fraction, by estimation, is 65 to 70%. The  left ventricle has normal function. The left ventricle has no regional  wall motion abnormalities. Left ventricular diastolic parameters were  normal.  2. Right ventricular systolic function is normal. The right ventricular  size is normal.  3. The mitral valve is normal in structure. Trivial mitral valve  regurgitation. No evidence of mitral stenosis.  4. The aortic valve is normal in structure. Aortic valve regurgitation is  not visualized. No aortic stenosis is present. _____________  Cardiac MRI 03/14/2020: Impressions: 1. Normal LV size and function EF 63% no LVH septal thickness 11 mm. No evidence of hypertrophic cardiomyopathy. 2.  Normal RV size and function. 3.  Normal cardiac valves AV is tri leaflet. 4. Normal aortic root 2.8 cm with normal arch vessels and no PDA/coarctation noted. 5.  Normal coronary artery origins with no anomaly. 6.  Trivial pericardial effusion. 7.  No ASD/VSD/PFO.   History of Present Illness     Nathan Clayton is a 19 y.o. male with a history of chest pain and possible hypertrophic cardiomyopathy who presents to the ED today with chest pain and near syncope. Patient follows with Dr. Gwenlyn Found. 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 He saw a pediatric cardiologist  in [place], [place], and was diagnosed with hypertrophic cardiomyopathy. Per review in Care Everywhere, he underwent and Cardiopulmonary Exercise Test in 12/2017 which was abnormal and showed mildly reduced exercise capacity consistent with ventilatory limitations. There was no evidence of exercise induced cardiac dysrhythmias, ischemia, or bronchospasm.He was seen in the ED in 04/2019 for chest pain that was worse with exertion. EKG showed LVH with repolarization changes. Enzymes were negative. He was felt to be stable for discharge with outpatient Cardiology follow-up. He was seen by Dr. Gwenlyn Found in 05/2019 and Echo was ordered for further evaluation. Echo showed LVEF of 60-65% with moderate LVH and asymmetric hypertrophy in the posterior wall. Average LV global longitudinal strain is -18.3. This pattern felt to be atypical for hypertrophic cardiomyopathy.   Patient presented to the [facility] ED on 03/13/2020 for further evaluation of chest pain and near syncope while at football practice at [employer]. Patient reported he had been practicing/running for 30 minutes when he developed left sided non-radiating chest pain. Described this pain as a tightness/sharpness. Similar to pain that he usual gets just slightly worse. He stated he typically has chest pain at least 2 times per week, usually with exertion but sometimes at rest. He noted shortness of breath, palpitations, and sweating with the chest pain but he was running in 90 degree heat at the time. No nausea or vomiting. After onset of chest pain, he developed lightheadedness and dizziness and felt like he may pass out. No orthopnea, PND, or edema. No recent fevers or illnesses. No URI symptoms or GI symptoms. Patient told his coach about his symptoms and  coach placed him in an ice bath. Chest pain persisted so he came to the ED for further evaluation. Patient received 324 mg of Aspirin prior to arrival.  In the ED, vitals stable. EKG showed normal sinus  rhythm, rate 97 bpm, with right axis deviation, repolarization pattern in anterior leads, and mild T wave inversions in inferior leads (may be repolarization changes from LVH). Initial high-sensitivity troponin negative. WBC 6.4, Hgb 14.8, Plts 309. Na 139, K 3.8, Glucose 87, BUN 12, Cr 1.52. Albumin 4.2, AST 49, ALT 23, Alk Phos 47, Total Bili 0.8. COVID-19 pending.   At the time of initial evaluation, patient still having 5/6 out of 10 chest pain but resting comfortably.   He denies any tobacco, alcohol, or recreational drug use. No known family history of HOCM. However, he does have a family history of a heart disease with paternal grandfather having a MI in his mid 21's to 101's. Patient's father also reports that his grandparents (patient's great grandparents) have history of MI. Father states several of his relatives died suddenly from a MI.  Hospital Course     Consultants: None.   Chest Pain Patient presents with chest pain and near syncope while at football practice. EKG shows no acute ischemic changes compared to prior tracings. Initial high-sensitivity troponin negative x2. Echo showed LVEF 65-70% with normal wall motion and diastolic function. Interestingly no LVH noted (prior Echo in 05/2019 showed asymmetric hypertrophy measuring 72m in posterior wall). No significant valvular disease reported. Cardiac MRI was ordered for further evaluation of possible HOCM and to rule out any anomalous coronary arteries. MRI showed no LVH septal thickness, no evidence of hypertrophic cardiomyopathy, and normal coronary artery origins. Chest pain resolved after IV fluids. Symptoms possibly due to dehydration. CAD/ACS not suspected. No activity restrictions felt to be necessary. OK for patient to continue to play football. However, discussed the importance of staying well hydrated.  Hypertrophic Cardiomyopathy Diagnosed with hypertrophic cardiomyopathy [place] and  felt to be due to athlete's heart vs hypertensive changes. Noted to have elevated BP at multiple visit raising concern for hypertension but patient denies formal diagnosis of this.Cardiopulmonary Exercise Test in 12/2017 which was abnormal and showed mildly reduced exercise capacity consistent with ventilatory limitations. There was no evidence of exercise induced cardiac dysrhythmias, ischemia, or bronchospasm. Echo in 05/2019 showed LVEF of 60-65% with moderate LVH and asymmetric hypertrophy measuring 120min posterior wall (felt to be atypical for hypertrophic cardiomyopathy). Average LV global longitudinal strain -18.3. Echo this admission showed LVEF 65-70% with normal wall motion, normal diastolic function, and no evidence of LVH. He had a significant provocable murmur on presentation which improved after fluids. This made [place] HOCM. However, cardiac MRI showed no LVH septal thickness and no evidence of hypertrophic cardiomyopathy. No No activity restrictions felt to be necessary. OK for patient to continue to play football. However, discussed the importance of staying well hydrated.  Near Syncope Felt to be due exercising in extreme heat/dehydration given mild AKI. Echo as above. No concerning arrhythmias noted on telemetry. Received a total of 2L of IV fluids with improvement in symptoms. Orthostatic negative prior to discharge.  AKI Creatinine 1.51 on presentation. Baseline 1.15. Suspect this is secondary to dehydration/hypovolemia. Received a total of 2L of IV fluids with improvement of creatinine to 1.33. Encouraged patient to drink plenty of fluids. Follow-up with PCP.   Elevated AST AST minimally elevated at 49 on presentation and 51 today. Otherwise, LFTs normal.  Patient denies any alcohol use. He does not take any medications at home. Recommend follow-up with PCP.  Mild Anion Gap - Resolved Minimal elevated anion gap 16 on admission but resolved on repeat labs today.  Patient  seen and examined by Dr. Debara Pickett today and determined to be stable for discharge. Outpatient follow-up has been arranged. Medications as below.  Did the patient have an acute coronary syndrome (MI, NSTEMI, STEMI, etc) this admission?:  No                               Did the patient have a percutaneous coronary intervention (stent / angioplasty)?:  No.   _____________  Discharge Vitals Blood pressure 133/75, pulse 72, temperature 98.5 F (36.9 C), temperature source Oral, resp. rate 16, SpO2 100 %.  There were no vitals filed for this visit.  Labs & Radiologic Studies    CBC Recent Labs    03/13/20 1047  WBC 6.4  HGB 14.8  HCT 48.5  MCV 91.0  PLT 631   Basic Metabolic Panel Recent Labs    03/13/20 1047 03/14/20 0621  NA 139 141  K 3.8 3.9  CL 103 105  CO2 20* 25  GLUCOSE 87 83  BUN 12 14  CREATININE 1.52* 1.33*  CALCIUM 9.6 8.9   Liver Function Tests Recent Labs    03/13/20 1047 03/14/20 0621  AST 49* 51*  ALT 23 22  ALKPHOS 47 43  BILITOT 0.8 1.0  PROT 7.8 6.6  ALBUMIN 4.2 3.6   No results for input(s): LIPASE, AMYLASE in the last 72 hours. High Sensitivity Troponin:   Recent Labs  Lab 03/13/20 1047 03/13/20 1234  TROPONINIHS 6 10    BNP Invalid input(s): POCBNP D-Dimer No results for input(s): DDIMER in the last 72 hours. Hemoglobin A1C No results for input(s): HGBA1C in the last 72 hours. Fasting Lipid Panel No results for input(s): CHOL, HDL, LDLCALC, TRIG, CHOLHDL, LDLDIRECT in the last 72 hours. Thyroid Function Tests No results for input(s): TSH, T4TOTAL, T3FREE, THYROIDAB in the last 72 hours.  Invalid input(s): FREET3 _____________  MR CARDIAC MORPHOLOGY W WO CONTRAST  Result Date: 03/14/2020 CLINICAL DATA:  Syncope R/O Hypertrophic Cardiomyopathy EXAM: CARDIAC MRI TECHNIQUE: The patient was scanned on a 1.5 Tesla GE magnet. A dedicated cardiac coil was used. Functional imaging was done using Fiesta sequences. 2,3, and 4 chamber views  were done to assess for RWMA's. Modified Simpson's rule using a short axis stack was used to calculate an ejection fraction on a dedicated work Conservation officer, nature. The patient received 10 cc of Gadavist After 10 minutes inversion recovery sequences were used to assess for infiltration and scar tissue. CONTRAST:  SHFWYOVZ FINDINGS: Normal cardiac chamber sizes. No ASD/PFO/VSD. Trivial pericardial effusion. Normal aortic root and arch vessels No coarctation or PDA noted. Normal cardiac valves Normal RVOT No SAM or LVOT turbulence Normal aortic root 2.8 cm. Normal RV size and function. Normal LV size and function no LVH septal thickness 11 mm. Quantitative EF 63% (EDV 136 cc ESV 50 cc SV 85 cc) Delayed enhancement images with no gadolinium uptake. No evidence of hypertrophic cardiomyopathy although he does have somewhat apically displaced papillary muscles with hypertrophy of the anterolateral muscle IMPRESSION: 1. Normal LV size and function EF 63% no LVH septal thickness 11 mm No evidence of hypertrophic cardiomyopathy 2.  Normal RV size and function 3.  Normal cardiac valves AV is tri  leaflet 4. Normal aortic root 2.8 cm with normal arch vessels and no PDA/coarctation noted 5.  Normal coronary artery origins with no anomaly 6.  Trivial pericardial effusion 7.  No ASD/VSD/PFO [place] Electronically Signed   By: Jenkins Rouge M.D.   On: 03/14/2020 11:50   ECHOCARDIOGRAM COMPLETE  Result Date: 03/13/2020    ECHOCARDIOGRAM REPORT   Patient Name:   Nathan Clayton Date of Exam: 03/13/2020 Medical Rec #:  [id number]       Height:       74.0 in Accession #:    [id number]      Weight:       187.0 lb Date of Birth:  05/15/2001      BSA:          2.112 m Patient Age:    18 years        BP:           143/75 mmHg Patient Gender: M               HR:           75 bpm. Exam Location:  Inpatient Procedure: 2D Echo, Cardiac Doppler and Color Doppler Indications:    R07.9* Chest pain, unspecified; R01.1 Murmur;  I42.2 HOCM  History:        Patient has prior history of Echocardiogram examinations, most                 recent 05/22/2019. Cardiomyopathy. Pericaridal Effusion.  Sonographer:    Jonelle Sidle Dance Referring Phys: [id number] KENNETH C HILTY IMPRESSIONS  1. Left ventricular ejection fraction, by estimation, is 65 to 70%. The left ventricle has normal function. The left ventricle has no regional wall motion abnormalities. Left ventricular diastolic parameters were normal.  2. Right ventricular systolic function is normal. The right ventricular size is normal.  3. The mitral valve is normal in structure. Trivial mitral valve regurgitation. No evidence of mitral stenosis.  4. The aortic valve is normal in structure. Aortic valve regurgitation is not visualized. No aortic stenosis is present. FINDINGS  Left Ventricle: Left ventricular ejection fraction, by estimation, is 65 to 70%. The left ventricle has normal function. The left ventricle has no regional wall motion abnormalities. The left ventricular internal cavity size was small. There is no left ventricular hypertrophy. Left ventricular diastolic parameters were normal. Right Ventricle: The right ventricular size is normal. No increase in right ventricular wall thickness. Right ventricular systolic function is normal. Left Atrium: Left atrial size was normal in size. Right Atrium: Right atrial size was normal in size. Pericardium: There is no evidence of pericardial effusion. Mitral Valve: The mitral valve is normal in structure. Trivial mitral valve regurgitation. No evidence of mitral valve stenosis. Tricuspid Valve: The tricuspid valve is normal in structure. Tricuspid valve regurgitation is trivial. Aortic Valve: The aortic valve is normal in structure. Aortic valve regurgitation is not visualized. No aortic stenosis is present. Pulmonic Valve: The pulmonic valve was normal in structure. Pulmonic valve regurgitation is not visualized. No evidence of pulmonic stenosis.  Aorta: The aortic root and ascending aorta are structurally normal, with no evidence of dilitation. IAS/Shunts: The atrial septum is grossly normal.  LEFT VENTRICLE PLAX 2D LVIDd:         5.00 cm  Diastology LVIDs:         2.70 cm  LV e' lateral:   20.10 cm/s LV PW:         1.30 cm  LV E/e'  lateral: 5.6 LV IVS:        0.90 cm  LV e' medial:    12.70 cm/s LVOT diam:     2.10 cm  LV E/e' medial:  8.9 LV SV:         70 LV SV Index:   33 LVOT Area:     3.46 cm  RIGHT VENTRICLE             IVC RV Basal diam:  2.90 cm     IVC diam: 1.00 cm RV S prime:     11.00 cm/s TAPSE (M-mode): 2.0 cm LEFT ATRIUM             Index       RIGHT ATRIUM           Index LA diam:        2.90 cm 1.37 cm/m  RA Area:     11.70 cm LA Vol (A2C):   36.4 ml 17.23 ml/m RA Volume:   26.30 ml  12.45 ml/m LA Vol (A4C):   42.2 ml 19.98 ml/m LA Biplane Vol: 41.1 ml 19.46 ml/m  AORTIC VALVE LVOT Vmax:   97.40 cm/s LVOT Vmean:  66.000 cm/s LVOT VTI:    0.201 m  AORTA Ao Root diam: 3.20 cm Ao Asc diam:  2.60 cm MITRAL VALVE MV Area (PHT): 3.08 cm     SHUNTS MV Decel Time: 246 msec     Systemic VTI:  0.20 m MV E velocity: 113.00 cm/s  Systemic Diam: 2.10 cm MV A velocity: 53.50 cm/s MV E/A ratio:  2.11 Mertie Moores MD Electronically signed by Mertie Moores MD Signature Date/Time: 03/13/2020/4:36:07 PM    Final    Disposition   Patient is being discharged home today in good condition.  Follow-up Plans & Appointments     Follow-up Information    Primary Care Provider. Call.   Why: Please call your primary care provider to schedule follow-up visit within the next 2-3 weeks.       Lorretta Harp, MD Follow up.   Specialties: Cardiology, Radiology Why: Hospital follow-up scheduled for 9/15/2021am at 9:00am. Please arrive 15 minutes early for check-in. If this date/time does not work for you, please call our office to reschedule.  Contact information: [address] [place] [address] [phone]                Discharge Instructions    Diet - low sodium heart healthy   Complete by: As directed    Increase activity slowly   Complete by: As directed       Discharge Medications   Allergies as of 03/14/2020   No Known Allergies     Medication List    You have not been prescribed any medications.        Outstanding Labs/Studies   N/A.  Duration of Discharge Encounter   Greater than 30 minutes including physician time.  Signed, Darreld Mclean, PA-C 03/14/2020, 1:26 PM

## 2020-03-14 NOTE — TOC Transition Note (Signed)
Transition of Care [facility]) - CM/SW Discharge Note   Patient Details  Name: Nathan Clayton MRN: [id number] Date of Birth: 03-20-2001  [facility]) CM/SW Contact:  Kermit Balo, RN Phone Number: 03/14/2020, 1:28 PM   Clinical Narrative:    Pt discharging home with self care. Pt has transportation home.    Final next level of care: Home/Self Care Barriers to Discharge: No Barriers Identified   Patient Goals and CMS Choice        Discharge Placement                       Discharge Plan and Services                                     Social Determinants of Health (SDOH) Interventions     Readmission Risk Interventions No flowsheet data found.

## 2020-04-16 ENCOUNTER — Other Ambulatory Visit: Payer: Self-pay

## 2020-04-16 ENCOUNTER — Encounter: Payer: Self-pay | Admitting: Cardiovascular Disease

## 2020-04-16 ENCOUNTER — Ambulatory Visit (INDEPENDENT_AMBULATORY_CARE_PROVIDER_SITE_OTHER): Payer: PRIVATE HEALTH INSURANCE | Admitting: Cardiovascular Disease

## 2020-04-16 VITALS — BP 130/80 | HR 71 | Ht 74.0 in | Wt 189.6 lb

## 2020-04-16 DIAGNOSIS — R0789 Other chest pain: Secondary | ICD-10-CM | POA: Diagnosis not present

## 2020-04-16 NOTE — Progress Notes (Signed)
Nathan Clayton returns for follow-up after after his recent 1 day hospitalization on 03/13/2020 for chest pain.  His evaluation was unremarkable.  He did have a cardiac MRI that showed no evidence of hypertrophic cardiomyopathy or anomalous coronary origin.  His heart appeared normal.  He is cleared to play football as well as other sports without limitation.  Is been asymptomatic since discharge.  I will see him back as needed.  Runell Gess, M.D., FACP, [facility], [facility] [address] [place], [place]  [address]  [phone] 04/16/2020 9:44 AM

## 2020-04-16 NOTE — Patient Instructions (Signed)

## 2021-07-29 IMAGING — MR MR CARD MORPHOLOGY WO/W CM
46 of 48 series · 46 of 48 positions shown · IV contrast (Gadavist)
Comparison: none

CLINICAL DATA: Syncope R/O Hypertrophic Cardiomyopathy

EXAM:
CARDIAC MRI
TECHNIQUE: The patient was scanned on a 1.5 Tesla GE magnet. A dedicated
cardiac coil was used. Functional imaging was done using Fiesta
sequences. [DATE], and 4 chamber views were done to assess for RWMA's.
Modified Krwawy rule using a short axis stack was used to
calculate an ejection fraction on a dedicated work station using
[employer]le software. The patient received 10 cc of Gadavist After 10
minutes inversion recovery sequences were used to assess for
infiltration and scar tissue.
CONTRAST:  Gadavist

[Series 4: t2_haste_db_tra_bh · axial · 8.0mm · 1.33mm/px · 1 of 18 slices shown]
[im 1/18]
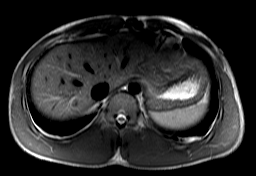

[Series 8: bSSFP · oblique · 8.0mm · 1.70mm/px · 1 of 25 slices shown (1 of 27)]
[im 1/25]
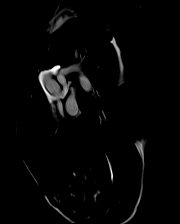

[Series 9: bSSFP · oblique · 8.0mm · 1.70mm/px · 1 of 25 slices shown (2 of 27)]
[im 1/25]
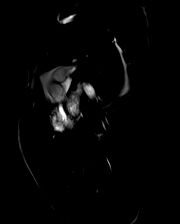

[Series 10: bSSFP · oblique · 8.0mm · 1.70mm/px · 1 of 25 slices shown (3 of 27)]
[im 1/25]
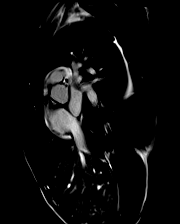

[Series 11: bSSFP · oblique · 8.0mm · 1.70mm/px · 1 of 25 slices shown (4 of 27)]
[im 1/25]
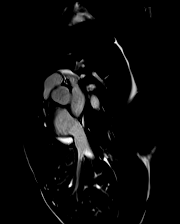

[Series 12: bSSFP · oblique · 8.0mm · 1.70mm/px · 1 of 25 slices shown (5 of 27)]
[im 1/25]
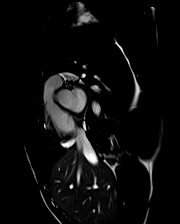

[Series 13: bSSFP · oblique · 8.0mm · 1.70mm/px · 1 of 25 slices shown (6 of 27)]
[im 1/25]
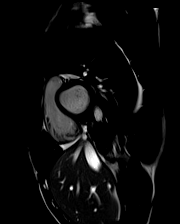

[Series 14: bSSFP · oblique · 8.0mm · 1.70mm/px · 1 of 25 slices shown (7 of 27)]
[im 1/25]
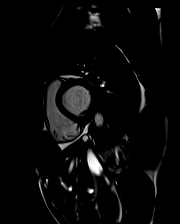

[Series 15: bSSFP · oblique · 8.0mm · 1.70mm/px · 1 of 25 slices shown (8 of 27)]
[im 1/25]
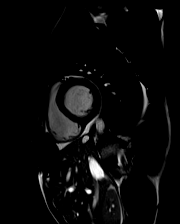

[Series 16: bSSFP · oblique · 8.0mm · 1.70mm/px · 1 of 25 slices shown (9 of 27)]
[im 1/25]
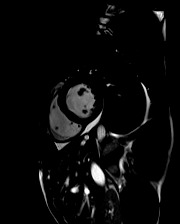

[Series 17: bSSFP · oblique · 8.0mm · 1.70mm/px · 1 of 25 slices shown (10 of 27)]
[im 1/25]
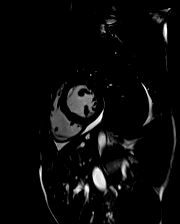

[Series 18: bSSFP · oblique · 8.0mm · 1.70mm/px · 1 of 25 slices shown (11 of 27)]
[im 1/25]
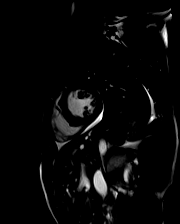

[Series 19: bSSFP · oblique · 8.0mm · 1.70mm/px · 1 of 25 slices shown (12 of 27)]
[im 1/25]
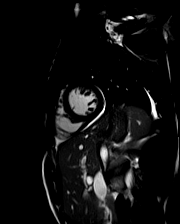

[Series 20: bSSFP · oblique · 8.0mm · 1.70mm/px · 1 of 25 slices shown (13 of 27)]
[im 1/25]
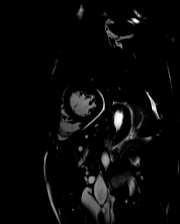

[Series 21: bSSFP · oblique · 8.0mm · 1.70mm/px · 1 of 25 slices shown (14 of 27)]
[im 1/25]
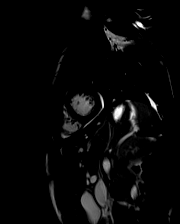

[Series 22: bSSFP · oblique · 8.0mm · 1.70mm/px · 1 of 25 slices shown (15 of 27)]
[im 1/25]
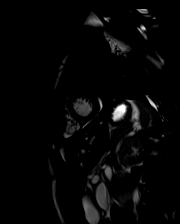

[Series 23: bSSFP · oblique · 8.0mm · 1.70mm/px · 1 of 25 slices shown (16 of 27)]
[im 1/25]
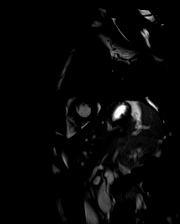

[Series 24: bSSFP · oblique · 8.0mm · 1.70mm/px · 1 of 25 slices shown (17 of 27)]
[im 1/25]
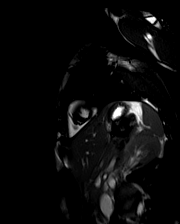

[Series 25: bSSFP · oblique · 8.0mm · 1.70mm/px · 1 of 25 slices shown (18 of 27)]
[im 1/25]
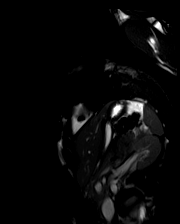

[Series 26: bSSFP · oblique · 8.0mm · 1.70mm/px · 1 of 25 slices shown (19 of 27)]
[im 1/25]
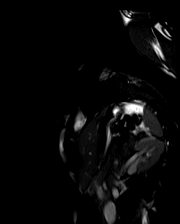

[Series 27: (id)_long_t1 · oblique · 8.0mm · 1.45mm/px · 1 of 24 slices shown]
[im 1/24]
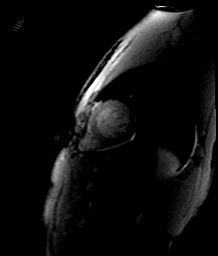

[Series 28: (id)_long_t1_moco · oblique · 8.0mm · 1.45mm/px · 1 of 24 slices shown]
[im 1/24]
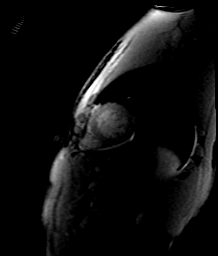

[Series 29: (id)_long_t1_moco_t1 · 1 of 3 slices shown (1 of 2)]
[im 1/3]
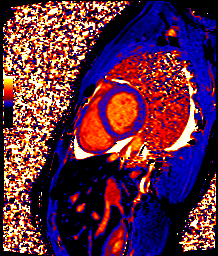

[Series 29: (id)_long_t1_moco_t1 · oblique · 8.0mm · 1.45mm/px · 1 of 3 slices shown (2 of 2)]
[im 1/3]
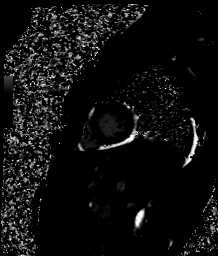

[Series 31: (id)_trufi · oblique · 8.0mm · 1.88mm/px · 1 of 9 slices shown]
[im 1/9]
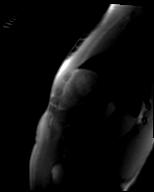

[Series 32: (id)_trufi_moco · oblique · 8.0mm · 1.88mm/px · 1 of 9 slices shown]
[im 1/9]
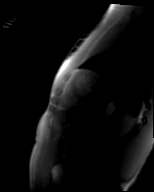

[Series 33: (id)_trufi_moco_t2 · oblique · 8.0mm · 1.88mm/px · 1 of 3 slices shown]
[im 1/3]
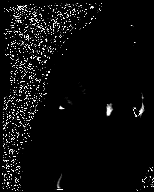

[Series 35: bSSFP · oblique · 6.0mm · 1.41mm/px · 1 of 25 slices shown (20 of 27)]
[im 1/25]
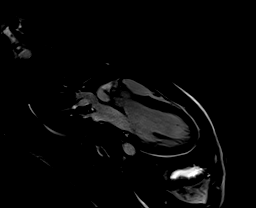

[Series 36: bSSFP · oblique · 6.0mm · 1.41mm/px · 1 of 25 slices shown (21 of 27)]
[im 1/25]
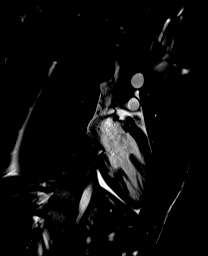

[Series 40: bSSFP · oblique · 6.0mm · 1.41mm/px · 1 of 25 slices shown (22 of 27)]
[im 1/25]
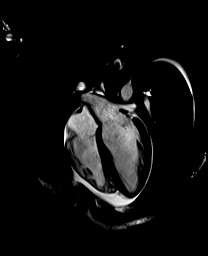

[Series 42: t1_tse_db sa · oblique · 5.0mm · 1.32mm/px · 1 of 14 slices shown (1 of 2)]
[im 1/14]
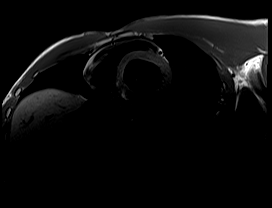

[Series 43: t1_tse_db sa · oblique · 5.0mm · 1.03mm/px · 1 of 10 slices shown (2 of 2)]
[im 1/10]
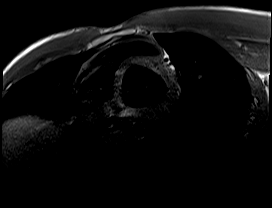

[Series 44: bSSFP · coronal · 6.0mm · 1.41mm/px · 1 of 25 slices shown (23 of 27)]
[im 1/25]
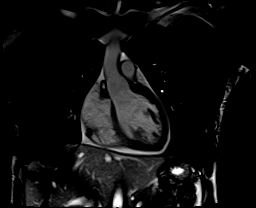

[Series 45: aortic valve cine · axial · 6.0mm · 1.41mm/px · 1 of 25 slices shown]
[im 1/25]
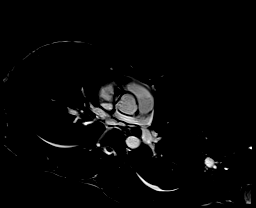

[Series 46: cine rvit · oblique · 6.0mm · 1.41mm/px · 1 of 25 slices shown]
[im 1/25]
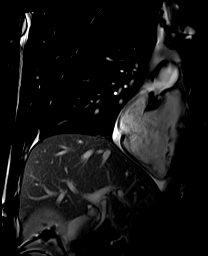

[Series 47: cine rvot · sagittal · 6.0mm · 1.41mm/px · 1 of 25 slices shown]
[im 1/25]
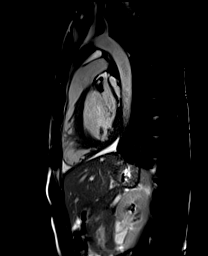

[Series 49: lge_single shot sa · oblique · 8.0mm · 1.98mm/px · 1 of 9 slices shown (1 of 2)]
[im 1/9]
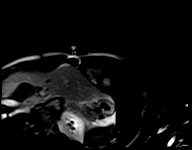

[Series 50: lge_single shot sa · oblique · 8.0mm · 1.98mm/px · 1 of 10 slices shown (2 of 2)]
[im 1/10]
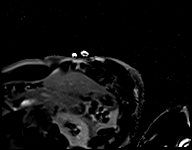

[Series 57: (id)_short_t1 · oblique · 8.0mm · 1.41mm/px · 1 of 27 slices shown]
[im 1/27]
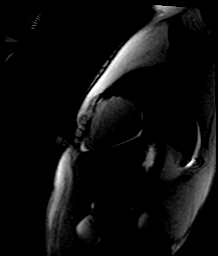

[Series 58: (id)_short_t1_moco · oblique · 8.0mm · 1.41mm/px · 1 of 27 slices shown]
[im 1/27]
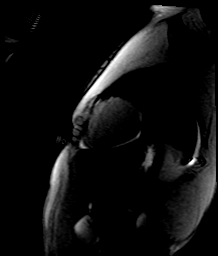

[Series 59: (id)_short_t1_moco_t1 · 1 of 3 slices shown (1 of 2)]
[im 1/3]
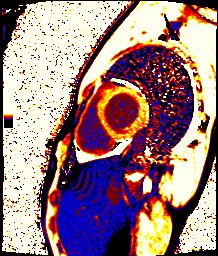

[Series 59: (id)_short_t1_moco_t1 · oblique · 8.0mm · 1.41mm/px · 1 of 3 slices shown (2 of 2)]
[im 1/3]
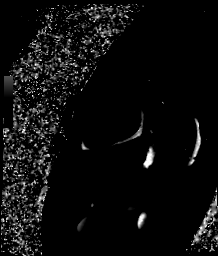

[Series 62: bSSFP · oblique · 6.0mm · 1.73mm/px · 1 of 15 slices shown (24 of 27)]
[im 1/15]
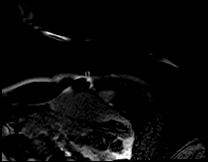

[Series 63: bSSFP · oblique · 6.0mm · 1.73mm/px · 1 of 15 slices shown (25 of 27)]
[im 1/15]
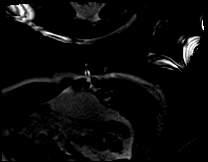

[Series 70: bSSFP · axial · 6.0mm · 1.73mm/px · 1 of 1 slices shown (26 of 27)]
[im 1/1]
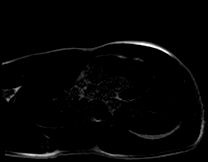

[Series 71: bSSFP · axial · 6.0mm · 1.73mm/px · 1 of 1 slices shown (27 of 27)]
[im 1/1]
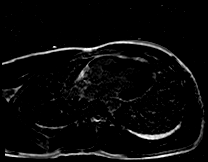

[46 of 48 positions shown; findings below may reference images not displayed]

FINDINGS: Normal cardiac chamber sizes. No ASD/PFO/VSD. Trivial pericardial
effusion. Normal aortic root and arch vessels No coarctation or PDA
noted. Normal cardiac valves Normal RVOT No CHOOCHART or LVOT turbulence
Normal aortic root 2.8 cm. Normal RV size and function. Normal LV
size and function no LVH septal thickness 11 mm. Quantitative EF 63%
(EDV 136 cc ESV 50 cc SV 85 cc) Delayed enhancement images with no
gadolinium uptake. No evidence of hypertrophic cardiomyopathy
although he does have somewhat apically displaced papillary muscles
with hypertrophy of the anterolateral muscle
IMPRESSION: 1. Normal LV size and function EF 63% no LVH septal thickness 11 mm
No evidence of hypertrophic cardiomyopathy

2.  Normal RV size and function

3.  Normal cardiac valves AV is tri leaflet

4. Normal aortic root 2.8 cm with normal arch vessels and no
PDA/coarctation noted

5.  Normal coronary artery origins with no anomaly

6.  Trivial pericardial effusion

7.  No ASD/VSD/PFO

[employer] [employer]
